# Patient Record
Sex: Male | Born: 1990 | Race: Black or African American | Hispanic: No | Marital: Single | Smoking: Never smoker
Health system: Southern US, Community
[De-identification: ages and names within clinical notes are randomized; demographics above are authoritative.]

---
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
## 2013-12-11 ENCOUNTER — Encounter (INDEPENDENT_AMBULATORY_CARE_PROVIDER_SITE_OTHER): Payer: Self-pay | Admitting: General Surgery

## 2013-12-11 ENCOUNTER — Ambulatory Visit (INDEPENDENT_AMBULATORY_CARE_PROVIDER_SITE_OTHER): Payer: Managed Care, Other (non HMO) | Admitting: General Surgery

## 2013-12-11 VITALS — BP 110/70 | HR 56 | Temp 98.2°F | Resp 14 | Ht 67.0 in | Wt 142.0 lb

## 2013-12-11 DIAGNOSIS — S3981XA Other specified injuries of abdomen, initial encounter: Secondary | ICD-10-CM | POA: Insufficient documentation

## 2013-12-11 DIAGNOSIS — IMO0002 Reserved for concepts with insufficient information to code with codable children: Secondary | ICD-10-CM

## 2013-12-11 NOTE — Patient Instructions (Addendum)
[facility] _______[facility]   INGUINAL HERNIA REPAIR: POST OP INSTRUCTIONS  Always review your discharge instruction sheet given to you by the facility where your surgery was performed. IF YOU HAVE DISABILITY OR FAMILY LEAVE FORMS, YOU MUST BRING THEM TO THE OFFICE FOR PROCESSING.   DO NOT GIVE THEM TO YOUR DOCTOR.  1. A  prescription for pain medication may be given to you upon discharge.  Take your pain medication as prescribed, if needed.  If narcotic pain medicine is not needed, then you may take acetaminophen (Tylenol) or ibuprofen (Advil) as needed. 2. Take your usually prescribed medications unless otherwise directed. 3. If you need a refill on your pain medication, please contact your pharmacy.  They will contact our office to request authorization. Prescriptions will not be filled after 5 pm or on week-ends. 4. You should follow a light diet the first 24 hours after arrival home, such as soup and crackers, etc.  Be sure to include lots of fluids daily.  Resume your normal diet the day after surgery. 5. Most patients will experience some swelling and bruising around the umbilicus or in the groin and scrotum.  Ice packs and reclining will help.  Swelling and bruising can take several days to resolve.  6. It is common to experience some constipation if taking pain medication after surgery.  Increasing fluid intake and taking a stool softener (such as Colace) will usually help or prevent this problem from occurring.  A mild laxative (Milk of Magnesia or Miralax) should be taken according to package directions if there are no bowel movements after 48 hours. 7. Unless discharge instructions indicate otherwise, you may remove your bandages 24-48 hours after surgery, and you may shower at that time.  You may have steri-strips (small skin tapes) in place directly over the incision.  These strips should be left on the skin for 7-10 days.  If your surgeon used skin glue on the incision, you  may shower in 24 hours.  The glue will flake off over the next 2-3 weeks.  Any sutures or staples will be removed at the office during your follow-up visit. 8. ACTIVITIES:  You may resume regular (light) daily activities beginning the next day-such as daily self-care, walking, climbing stairs-gradually increasing activities as tolerated.  You may have sexual intercourse when it is comfortable.  Refrain from any heavy lifting or straining until approved by your doctor. a. You may drive when you are no longer taking prescription pain medication, you can comfortably wear a seatbelt, and you can safely maneuver your car and apply brakes. b. RETURN TO WORK:  __________________________________________________________ 9. You should see your doctor in the office for a follow-up appointment approximately 2-3 weeks after your surgery.  Make sure that you call for this appointment within a day or two after you arrive home to insure a convenient appointment time. 10. OTHER INSTRUCTIONS:  __________________________________________________________________________________________________________________________________________________________________________________________  WHEN TO CALL YOUR DOCTOR: 1. Fever over 101.0 2. Inability to urinate 3. Nausea and/or vomiting 4. Extreme swelling or bruising 5. Continued bleeding from incision. 6. Increased pain, redness, or drainage from the incision  The clinic staff is available to answer your questions during regular business hours.  Please don't hesitate to call and ask to speak to one of the nurses for clinical concerns.  If you have a medical emergency, go to the nearest emergency room or call 911.  A surgeon from [facility] is always on call at the hospital   [address], [address], [place], [place]  [address] ?  [address] [address], [place], [place]   [address] [phone] ? [phone] ? FAX [phone] Web site:  [email or web address]   We will call you and schedule your surgery.

## 2013-12-11 NOTE — Progress Notes (Addendum)
Patient ID: Devin Fowler, male   DOB: [birth year], 23 y.o.   MRN: [id number]  Chief Complaint  Patient presents with  . New Evaluation    eval [facility]    HPI Devin Fowler is a 23 y.o. male.   HPI  He is referred by Dr. Salvatore Marvelobert Wainer for further evaluation and treatment of a left groin sports hernia. He is a defensive back in football. He noted some significant pain in the left groin area that progressed. An MRI was performed which demonstrated a tear of the groin musculature as well as of the adductor longus tendon. A stress fracture of the left medial superior pubic ramus was also noted. He is here to discuss treatment of a left groin sports hernia.  He is here with his father.   History reviewed. No pertinent past medical history.  History reviewed. No pertinent past surgical history.  History reviewed. No pertinent family history.  Social History History  Substance Use Topics  . Smoking status: Never Smoker   . Smokeless tobacco: Never Used  . Alcohol Use: No    Not on File  No current outpatient prescriptions on file.   No current facility-administered medications for this visit.    Review of Systems Review of Systems  Constitutional: Negative.   HENT: Negative.   Respiratory: Positive for cough.   Cardiovascular: Negative.   Gastrointestinal: Negative.   Genitourinary: Negative.   Musculoskeletal:       Left groin pain.  Neurological: Negative.     Blood pressure 110/70, pulse 56, temperature 98.2 F (36.8 C), temperature source Temporal, resp. rate 14, height 5\' 7"  (1.702 m), weight 142 lb (64.411 kg).  Physical Exam Physical Exam  Constitutional: He appears well-developed and well-nourished. No distress.  HENT:  Head: Normocephalic and atraumatic.  Abdominal: Soft. He exhibits no distension and no mass. There is no tenderness.  Genitourinary:  No inguinal bulges. Testicles are descended and without masses. No palpable inguinal hernias. There is  tenderness in the left groin area.    Data Reviewed MRI report.  Assessment    1. Left groin sports hernia. Also has a stress fracture and tear of the adductor longus tendon.     Plan    We discussed repair of the left groin sports hernia with mesh. We discussed the timetable for return to activity. I also told him the other 2 processes have to heal as well. I will speak with Dr. Thurston HoleWainer about timing of the surgery. We will then get back with him to schedule it.   I have explained the procedure, risks, and aftercare of inguinal hernia repair.  Risks include but are not limited to bleeding, infection, wound problems, anesthesia, recurrence, bladder or intestine injury, urinary retention, testicular dysfunction, chronic pain, mesh problems.  He seems to understand and agrees to proceed.       Westlynn Fifer J 12/11/2013, 12:03 PM   I spoke with Dr. Thurston HoleWainer who felt he would be okay to proceed with the repair of the left groin sports hernia at this time.

## 2013-12-21 DIAGNOSIS — K4090 Unilateral inguinal hernia, without obstruction or gangrene, not specified as recurrent: Secondary | ICD-10-CM

## 2013-12-22 ENCOUNTER — Other Ambulatory Visit (INDEPENDENT_AMBULATORY_CARE_PROVIDER_SITE_OTHER): Payer: Self-pay | Admitting: *Deleted

## 2013-12-22 ENCOUNTER — Telehealth (INDEPENDENT_AMBULATORY_CARE_PROVIDER_SITE_OTHER): Payer: Self-pay | Admitting: *Deleted

## 2013-12-22 MED ORDER — ONDANSETRON 4 MG PO TBDP: 4.0000 mg | ORAL_TABLET | Freq: Three times a day (TID) | ORAL | Status: DC | PRN

## 2013-12-22 MED ORDER — OXYCODONE HCL 5 MG PO TABS: 5.0000 mg | ORAL_TABLET | ORAL | Status: DC | PRN

## 2013-12-22 NOTE — Telephone Encounter (Signed)
Patients father called to report that patient is having issues with vomiting.  Instructed father that per protocol I was sending Zofran into his pharmacy.  Explained that he needs to try the Zofran then try to eat something bland then to take his pain medication.  Father states understanding and agreeable with POC at this time.

## 2013-12-22 NOTE — Telephone Encounter (Signed)
Pt's mother called to ask what can be done for pts nausea. I asked if she has spoken with pts father. He was advised of Zofran rx and to start pt on this asap. She states she will call home and ask pt's father if he has started pt on med.

## 2013-12-22 NOTE — Telephone Encounter (Signed)
Agree with Zofran.

## 2013-12-25 NOTE — Telephone Encounter (Signed)
Contacted the pt's parents this morning to see how he was doing.  He took the Zofran and is now also taking 800mg  Ibuprofen.  They took him back to school yesterday.  I told them to call us or have Flor call us if anything changes.  Reminded of the post op appointment as well.

## 2014-01-02 ENCOUNTER — Ambulatory Visit (INDEPENDENT_AMBULATORY_CARE_PROVIDER_SITE_OTHER): Payer: Managed Care, Other (non HMO) | Admitting: General Surgery

## 2014-01-02 ENCOUNTER — Encounter (INDEPENDENT_AMBULATORY_CARE_PROVIDER_SITE_OTHER): Payer: Self-pay | Admitting: General Surgery

## 2014-01-02 VITALS — BP 110/64 | HR 60 | Temp 98.1°F | Resp 14 | Ht 67.0 in | Wt 140.6 lb

## 2014-01-02 DIAGNOSIS — Z4889 Encounter for other specified surgical aftercare: Secondary | ICD-10-CM

## 2014-01-02 NOTE — Patient Instructions (Signed)
Continue light activities. 

## 2014-01-02 NOTE — Progress Notes (Signed)
Procedure:  Repair of left groin sports hernia with mesh  Date:  12/21/2013  History:  He is here for his postop visit. Initially had some significant problems with postop nausea and vomiting. This has resolved. He is no longer taking pain medication. He is walking easier. He is here with his Dad.  Exam: General- Is in NAD. Left groin incision is clean, intact, and solid  Assessment:  Doing well following repair of left groin sports hernia with mesh.  Plan:  Continue light activities. Return visit in 4 weeks.

## 2014-01-25 ENCOUNTER — Encounter (INDEPENDENT_AMBULATORY_CARE_PROVIDER_SITE_OTHER): Payer: Self-pay

## 2014-01-26 ENCOUNTER — Encounter (INDEPENDENT_AMBULATORY_CARE_PROVIDER_SITE_OTHER): Payer: Self-pay | Admitting: General Surgery

## 2014-01-26 ENCOUNTER — Ambulatory Visit (INDEPENDENT_AMBULATORY_CARE_PROVIDER_SITE_OTHER): Payer: Managed Care, Other (non HMO) | Admitting: General Surgery

## 2014-01-26 VITALS — BP 110/68 | HR 60 | Temp 98.4°F | Resp 14 | Ht 67.0 in | Wt 141.8 lb

## 2014-01-26 DIAGNOSIS — Z4889 Encounter for other specified surgical aftercare: Secondary | ICD-10-CM

## 2014-01-26 NOTE — Patient Instructions (Signed)
May start resuming some of your usual activities as long as there is no pain or discomfort. No lifting restrictions as long as there is no pain.  May start rehabilitation. May start slowly participating in track.

## 2014-01-26 NOTE — Progress Notes (Signed)
Procedure:  Repair of left groin sports hernia with mesh  Date:  12/21/2013  History:  He is here for his second postop visit.  He is doing much better overall has no complaints.  Exam: General- Is in NAD. Left groin incision is clean, intact, and solid with minimal swelling.  Assessment:  Continues to dowell following repair of left groin sports hernia with mesh.  Plan:  Can start resuming normal activities including rehabilitation and participating in the track team. Return visit as needed.

## 2014-01-30 ENCOUNTER — Encounter (INDEPENDENT_AMBULATORY_CARE_PROVIDER_SITE_OTHER): Payer: Managed Care, Other (non HMO) | Admitting: General Surgery

## 2019-09-01 ENCOUNTER — Other Ambulatory Visit: Payer: Self-pay

## 2019-09-01 ENCOUNTER — Ambulatory Visit (HOSPITAL_COMMUNITY)
Admission: EM | Admit: 2019-09-01 | Discharge: 2019-09-01 | Disposition: A | Discharge status: Home / Self Care | Payer: HRSA Program | Attending: Emergency Medicine | Admitting: Emergency Medicine

## 2019-09-01 ENCOUNTER — Encounter (HOSPITAL_COMMUNITY): Payer: Self-pay

## 2019-09-01 DIAGNOSIS — Z791 Long term (current) use of non-steroidal anti-inflammatories (NSAID): Secondary | ICD-10-CM | POA: Diagnosis not present

## 2019-09-01 DIAGNOSIS — M791 Myalgia, unspecified site: Secondary | ICD-10-CM | POA: Diagnosis not present

## 2019-09-01 DIAGNOSIS — Z20822 Contact with and (suspected) exposure to covid-19: Secondary | ICD-10-CM

## 2019-09-01 DIAGNOSIS — Z20828 Contact with and (suspected) exposure to other viral communicable diseases: Secondary | ICD-10-CM

## 2019-09-01 DIAGNOSIS — R05 Cough: Secondary | ICD-10-CM | POA: Insufficient documentation

## 2019-09-01 DIAGNOSIS — U071 COVID-19: Secondary | ICD-10-CM | POA: Diagnosis not present

## 2019-09-01 MED ORDER — IBUPROFEN 600 MG PO TABS: 600.0000 mg | ORAL_TABLET | Freq: Four times a day (QID) | ORAL | 0 refills | Status: AC | PRN

## 2019-09-01 MED ORDER — BENZONATATE 200 MG PO CAPS
200.0000 mg | ORAL_CAPSULE | Freq: Three times a day (TID) | ORAL | 0 refills | Status: AC | PRN
Start: 2019-09-01 — End: ?

## 2019-09-01 NOTE — ED Triage Notes (Signed)
Patient presents to Urgent Care with complaints of cough and generalized body aches since 2 days ago. Patient reports he had contact with positive covid friend.

## 2019-09-01 NOTE — ED Provider Notes (Signed)
HPI  SUBJECTIVE:  Devin Fowler is a 28 y.o. male who presents with body aches, cough productive of whitish mucus for several days.  States that he had contact with 3 COVID positive people, last weekend and 2 days ago.  No headaches, fevers, nasal congestion, sore throat, loss of sense of smell or taste, wheezing, chest pain, shortness of breath, nausea, vomiting, abdominal pain, diarrhea.  He has been taking Robitussin with improvement in his symptoms.  No aggravating factors.  Has not tried anything else.  Past medical history negative for asthma, diabetes, hypertension, HIV, immunocompromise, chronic kidney disease.  He is not a smoker.  PMD: Dr. Charlynn Grimes.    History reviewed. No pertinent past medical history.  History reviewed. No pertinent surgical history.  Family History  Problem Relation Age of Onset  . Healthy Mother   . Diabetes Father     Social History   Tobacco Use  . Smoking status: Never Smoker  . Smokeless tobacco: Never Used  Substance Use Topics  . Alcohol use: No  . Drug use: No    No current facility-administered medications for this encounter.   Current Outpatient Medications:  .  benzonatate (TESSALON) 200 MG capsule, Take 1 capsule (200 mg total) by mouth 3 (three) times daily as needed for cough., Disp: 30 capsule, Rfl: 0 .  ibuprofen (ADVIL) 600 MG tablet, Take 1 tablet (600 mg total) by mouth every 6 (six) hours as needed., Disp: 30 tablet, Rfl: 0  No Known Allergies   ROS  As noted in HPI.   Physical Exam  BP 128/62 (BP Location: Left Arm)   Pulse 68   Temp 98.2 F (36.8 C) (Temporal)   Resp 16   SpO2 97%   Constitutional: Well developed, well nourished, no acute distress Eyes: PERRL, EOMI, conjunctiva normal bilaterally HENT: Normocephalic, atraumatic,mucus membranes moist.  No nasal congestion. Neck: No cervical lymphadenopathy Respiratory: Clear to auscultation bilaterally, no rales, no wheezing, no rhonchi Cardiovascular:  Normal rate and rhythm, no murmurs, no gallops, no rubs GI: Nondistended skin: No rash, skin intact Musculoskeletal: no deformities Neurologic: Alert & oriented x 3, CN III-XII grossly intact, no motor deficits, sensation grossly intact Psychiatric: Speech and behavior appropriate   ED Course   Medications - No data to display  Orders Placed This Encounter  Procedures  . Novel Coronavirus, N[facility] (Hosp order, Send-out to Ref Lab; TAT 18-24 hrs    Standing Status:   Standing    Number of Occurrences:   1    Order Specific Question:   Is this test for diagnosis or screening    Answer:   Diagnosis of ill patient    Order Specific Question:   Symptomatic for COVID-19 as defined by CDC    Answer:   Yes    Order Specific Question:   Date of Symptom Onset    Answer:   08/30/2019    Order Specific Question:   Hospitalized for COVID-19    Answer:   Yes    Order Specific Question:   Admitted to ICU for COVID-19    Answer:   No    Order Specific Question:   Previously tested for COVID-19    Answer:   No    Order Specific Question:   Resident in a congregate (group) care setting    Answer:   No    Order Specific Question:   Employed in healthcare setting    Answer:   No   No results found for this  or any previous visit (from the past 24 hour(s)). No results found.  ED Clinical Impression  1. Suspected COVID-19 virus infection   2. Exposure to COVID-19 virus      ED Assessment/Plan  COVID test sent.  Continue Robitussin.  Will send home with Tessalon as needed, ibuprofen 600 mg combined with 1 g of Tylenol as needed for body aches, headaches.  COVID work note.  Follow-up with PMD as needed, to the ER if he gets worse.  Discussed labs, imaging, MDM, treatment plan, and plan for follow-up with patient Discussed sn/sx that should prompt return to the ED. patient agrees with plan.   Meds ordered this encounter  Medications  . ibuprofen (ADVIL) 600 MG tablet    Sig: Take 1 tablet (600  mg total) by mouth every 6 (six) hours as needed.    Dispense:  30 tablet    Refill:  0  . benzonatate (TESSALON) 200 MG capsule    Sig: Take 1 capsule (200 mg total) by mouth 3 (three) times daily as needed for cough.    Dispense:  30 capsule    Refill:  0    *This clinic note was created using Scientist, clinical (histocompatibility and immunogenetics). Therefore, there may be occasional mistakes despite careful proofreading.  ?    Domenick Gong, MD 09/01/19 [phone]

## 2019-09-01 NOTE — Discharge Instructions (Addendum)
Take 1 g of Tylenol combined with 600 mg of ibuprofen 3 or 4 times a day as needed for body aches, headaches, fever.  Continue Robitussin.  If the Robitussin is not helping, try the Tessalon.

## 2019-09-03 LAB — NOVEL CORONAVIRUS, NAA (HOSP ORDER, SEND-OUT TO REF LAB; TAT 18-24 HRS): SARS-CoV-2, NAA: DETECTED — AB

## 2019-09-04 ENCOUNTER — Telehealth (HOSPITAL_COMMUNITY): Payer: Self-pay | Admitting: Emergency Medicine

## 2019-09-04 NOTE — Telephone Encounter (Signed)
Positive Covid, Attempted to reach patient. No answer at this time. Voicemail left.

## 2019-09-05 ENCOUNTER — Telehealth (HOSPITAL_COMMUNITY): Payer: Self-pay | Admitting: Emergency Medicine

## 2019-09-05 NOTE — Telephone Encounter (Signed)
Patient contacted and made aware of  Positive covid  results, all questions answered   

## 2019-09-05 NOTE — Telephone Encounter (Signed)
Attempted to reach patient x2. No answer at this time. Voicemail left.    

## 2022-02-05 ENCOUNTER — Other Ambulatory Visit: Payer: Self-pay

## 2022-02-05 ENCOUNTER — Ambulatory Visit: Payer: Self-pay | Admitting: Sports Medicine

## 2022-02-05 NOTE — Progress Notes (Deleted)
? ?   Benito Mccreedy D.Merril Abbe ?[facility] ?[place] ?Phone: [phone] ?  ?Assessment and Plan:   ?  ?There are no diagnoses linked to this encounter.  ?*** ?  ?Pertinent previous records reviewed include *** ?  ?Follow Up: ***  ? ?  ?Subjective:   ?I, Pincus Badder, am serving as a Education administrator for [employer] ? ?Chief Complaint: left peck injury  ? ?HPI:  ? ?02/05/22 ?Patient is a 31 year old male complaining of left peck pain. Patient states ? ?Relevant Historical Information: *** ? ?Additional pertinent review of systems negative. ? ? ?Current Outpatient Medications:  ?  benzonatate (TESSALON) 200 MG capsule, Take 1 capsule (200 mg total) by mouth 3 (three) times daily as needed for cough., Disp: 30 capsule, Rfl: 0 ?  ibuprofen (ADVIL) 600 MG tablet, Take 1 tablet (600 mg total) by mouth every 6 (six) hours as needed., Disp: 30 tablet, Rfl: 0  ? ?Objective:   ?  ?There were no vitals filed for this visit.  ?  ?There is no height or weight on file to calculate BMI.  ?  ?Physical Exam:   ? ?*** ? ? ?Electronically signed by:  ?Benito Mccreedy D.Merril Abbe ?[facility] ?7:56 AM 02/05/22 ?
